# Patient Record
Sex: Male | Born: 1975 | Race: White | Hispanic: No | Marital: Single | Smoking: Former smoker
Health system: Southern US, Community
[De-identification: ages and names within clinical notes are randomized; demographics above are authoritative.]

## PROBLEM LIST (undated history)

## (undated) DIAGNOSIS — R12 Heartburn: Secondary | ICD-10-CM

## (undated) DIAGNOSIS — R37 Sexual dysfunction, unspecified: Secondary | ICD-10-CM

## (undated) DIAGNOSIS — R42 Dizziness and giddiness: Secondary | ICD-10-CM

## (undated) DIAGNOSIS — R350 Frequency of micturition: Secondary | ICD-10-CM

## (undated) DIAGNOSIS — R51 Headache: Secondary | ICD-10-CM

## (undated) DIAGNOSIS — G479 Sleep disorder, unspecified: Secondary | ICD-10-CM

## (undated) DIAGNOSIS — F429 Obsessive-compulsive disorder, unspecified: Secondary | ICD-10-CM

## (undated) DIAGNOSIS — K30 Functional dyspepsia: Secondary | ICD-10-CM

## (undated) HISTORY — PX: FEMUR FRACTURE SURGERY: SHX633

## (undated) HISTORY — DX: Dizziness and giddiness: R42

## (undated) HISTORY — DX: Sexual dysfunction, unspecified: R37

## (undated) HISTORY — DX: Obsessive-compulsive disorder, unspecified: F42.9

## (undated) HISTORY — DX: Frequency of micturition: R35.0

## (undated) HISTORY — DX: Sleep disorder, unspecified: G47.9

## (undated) HISTORY — DX: Functional dyspepsia: K30

## (undated) HISTORY — DX: Headache: R51

## (undated) HISTORY — DX: Heartburn: R12

## (undated) HISTORY — PX: NASAL SEPTUM SURGERY: SHX37

---

## 2011-01-10 ENCOUNTER — Encounter: Payer: Self-pay | Admitting: Cardiology

## 2011-01-10 ENCOUNTER — Ambulatory Visit (INDEPENDENT_AMBULATORY_CARE_PROVIDER_SITE_OTHER): Payer: 59 | Admitting: Cardiology

## 2011-01-10 DIAGNOSIS — R002 Palpitations: Secondary | ICD-10-CM | POA: Insufficient documentation

## 2011-01-10 DIAGNOSIS — R079 Chest pain, unspecified: Secondary | ICD-10-CM | POA: Insufficient documentation

## 2011-01-10 NOTE — Progress Notes (Signed)
Parker Owens Date of Birth: 1975-08-04 Medical Record #[id number]  History of Present Illness: Parker Owens is a pleasant 35 year old white male who is seen at the request of Dr. Conley Owens for evaluation of chest pain. In general he has been in good health. Over the past 2 months he started a running program and found that when he was 5-10 minutes into his run he would get a vague discomfort in his mid chest described as a dull ache. It was 2/10. Sometimes he would have discomfort in his right shoulder and other times he would get discomfort in his left shoulder. He would just run through his discomfort and it would persist throughout his workout and then resolve with rest. There's been no change in these pattern over the past 2 months. He previously was biking and noted no problems with biking. He has been training for a Psychologist, occupational. He has no cardiac risk factors.  No current outpatient prescriptions on file prior to visit.    No Known Allergies  Past Medical History  Diagnosis Date  . OCD (obsessive compulsive disorder)   . Sleep difficulties   . Chest pain   . Hemorrhoids   . Heartburn   . Indigestion   . Constipation   . Frequent urination     Problems starting or stopping urine flow  . Sexual dysfunction   . Headache   . Dizziness     Past Surgical History  Procedure Date  . Nasal septum surgery   . Femur fracture surgery     History  Smoking status  . Former Smoker  . Quit date: 01/10/2008  Smokeless tobacco  . Not on file    History  Alcohol Use  . Yes    Family History  Problem Relation Age of Onset  . Heart disease Mother   . Hypertension Mother   . Hyperlipidemia Maternal Grandmother   . Heart attack Maternal Grandfather   . Hyperlipidemia Maternal Grandfather   . Hypertension Maternal Grandfather     Review of Systems: The review of systems is positive for history of intermittent palpitations. He has noted his heart rate as high as 180 beats per  minute. He doesn't Valsalva maneuver and this goes away. These episodes occur less than once a month. These are unassociated with his chest pain.  All other systems were reviewed and are negative.  Physical Exam: BP 115/71  Pulse 63  Ht 6\' 1"  (1.854 m)  Wt 196 lb (88.905 kg)  BMI 25.86 kg/m2 The patient is alert and oriented x 3.  The mood and affect are normal.  The skin is warm and dry.  Color is normal.  The HEENT exam reveals that the sclera are nonicteric.  The mucous membranes are moist.  The carotids are 2+ without bruits.  There is no thyromegaly.  There is no JVD.  The lungs are clear.  The chest wall is non tender.  The heart exam reveals a regular rate with a normal S1 and S2.  There are no murmurs, gallops, or rubs.  The PMI is not displaced.   Abdominal exam reveals good bowel sounds.  There is no guarding or rebound.  There is no hepatosplenomegaly or tenderness.  There are no masses.  Exam of the legs reveal no clubbing, cyanosis, or edema.  The legs are without rashes.  The distal pulses are intact.  Cranial nerves II - XII are intact.  Motor and sensory functions are intact.  The gait is normal.  LABORATORY DATA: ECG today shows normal sinus rhythm with a rate of 59 beats per minute. It is a normal ECG. Prior ECG aturgent care was also normal.  Assessment / Plan:

## 2011-01-10 NOTE — Assessment & Plan Note (Signed)
His symptoms are somewhat atypical for angina. He has no cardiac risk factors for premature coronary disease. I think his risk of coronary disease is low. I suspect that his symptoms are related to recent efforts to increase his conditioning with running. I recommended that he continue his exercise program. I suspect that his symptoms will improve with continued improvement in his exercise tolerance. If he has any worsening of these symptoms or if they persist then we can consider a screening stress test. However, at this point I think his pretest likelihood of disease is very low.

## 2011-01-10 NOTE — Assessment & Plan Note (Signed)
His history is suggestive of intermittent SVT. 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 I recommended he avoid caffeine and decongestants. If he has increased symptoms with either frequency or severity we could consider medical therapy with a beta blocker once we have documented his arrhythmia. At this point he is quite comfortable using the Valsalva maneuver.

## 2011-01-10 NOTE — Patient Instructions (Signed)
Continue to exercise and build up your tolerance. If your chest pain worsens or if it doesn't improve with conditioning then we can do a screening stress test.  Avoid caffeine and decongestants. (pseudoephredrine)  If you have a sustained episode of palpitations try and document on a monitor. If these become more frequent or severe let me know and we could try some medical.  I will see you again as needed.

## 2011-01-25 ENCOUNTER — Telehealth: Payer: Self-pay | Admitting: Cardiology

## 2011-01-25 NOTE — Telephone Encounter (Signed)
Patient called, stated he was told to call back to report any problems.Stated last Thursday night while running had episode of chest pain rt. shoulder pain and jaw pain ,nausea.Lasted appox. .while running and lasted appox. 2 min. after running.Has not ran any more since, wanted to know what to do.

## 2011-01-25 NOTE — Telephone Encounter (Signed)
lmtc

## 2011-01-25 NOTE — Telephone Encounter (Signed)
New Msg: Pt calling c/o chest pain and shoulder pain in the past. Pt said last time he went running pt had increased chest pain, shoulder pain, jaw pain and nasuea. Pt not c/o chest pain currently. Pt was told to contact Dr. Justin Mend if pt chest pain and/or shoulder pain got worse or if pt has any other symptoms. Please return pt call to discuss further.

## 2011-01-25 NOTE — Telephone Encounter (Signed)
Patient called no answer,lmtc 

## 2011-01-25 NOTE — Telephone Encounter (Signed)
I would recommend scheduling him for a routine stress test since he is still having symptoms. Parker Owens

## 2011-01-26 NOTE — Telephone Encounter (Signed)
Patient was called and told Dr. [place] recommends a routine stress test .Patient agreed to schedule treadmill.

## 2011-01-31 ENCOUNTER — Telehealth: Payer: Self-pay | Admitting: *Deleted

## 2011-01-31 NOTE — Telephone Encounter (Signed)
Left message on 01/28/11/01/31/11 for patient to call and scheduled treadmill. Patient has not call me back.

## 2011-02-10 ENCOUNTER — Encounter: Payer: Self-pay | Admitting: *Deleted

## 2011-02-10 NOTE — Telephone Encounter (Signed)
Sent him a letter to call us back to schedule treadmill. Have left several messages w/no return call.

## 2012-10-30 ENCOUNTER — Emergency Department (HOSPITAL_BASED_OUTPATIENT_CLINIC_OR_DEPARTMENT_OTHER)
Admission: EM | Admit: 2012-10-30 | Discharge: 2012-10-30 | Disposition: A | Discharge status: Home / Self Care | Payer: Worker's Compensation | Attending: Emergency Medicine | Admitting: Emergency Medicine

## 2012-10-30 ENCOUNTER — Emergency Department (HOSPITAL_BASED_OUTPATIENT_CLINIC_OR_DEPARTMENT_OTHER): Payer: Worker's Compensation

## 2012-10-30 ENCOUNTER — Encounter (HOSPITAL_BASED_OUTPATIENT_CLINIC_OR_DEPARTMENT_OTHER): Payer: Self-pay

## 2012-10-30 DIAGNOSIS — F429 Obsessive-compulsive disorder, unspecified: Secondary | ICD-10-CM | POA: Insufficient documentation

## 2012-10-30 DIAGNOSIS — Y939 Activity, unspecified: Secondary | ICD-10-CM | POA: Insufficient documentation

## 2012-10-30 DIAGNOSIS — Y9289 Other specified places as the place of occurrence of the external cause: Secondary | ICD-10-CM | POA: Insufficient documentation

## 2012-10-30 DIAGNOSIS — S0990XA Unspecified injury of head, initial encounter: Secondary | ICD-10-CM

## 2012-10-30 DIAGNOSIS — R42 Dizziness and giddiness: Secondary | ICD-10-CM | POA: Insufficient documentation

## 2012-10-30 DIAGNOSIS — K3189 Other diseases of stomach and duodenum: Secondary | ICD-10-CM | POA: Insufficient documentation

## 2012-10-30 DIAGNOSIS — Z8719 Personal history of other diseases of the digestive system: Secondary | ICD-10-CM | POA: Insufficient documentation

## 2012-10-30 DIAGNOSIS — Z8659 Personal history of other mental and behavioral disorders: Secondary | ICD-10-CM | POA: Insufficient documentation

## 2012-10-30 DIAGNOSIS — S060X9A Concussion with loss of consciousness of unspecified duration, initial encounter: Secondary | ICD-10-CM | POA: Insufficient documentation

## 2012-10-30 DIAGNOSIS — Z791 Long term (current) use of non-steroidal anti-inflammatories (NSAID): Secondary | ICD-10-CM | POA: Insufficient documentation

## 2012-10-30 DIAGNOSIS — R55 Syncope and collapse: Secondary | ICD-10-CM | POA: Insufficient documentation

## 2012-10-30 DIAGNOSIS — W2209XA Striking against other stationary object, initial encounter: Secondary | ICD-10-CM | POA: Insufficient documentation

## 2012-10-30 DIAGNOSIS — Z79899 Other long term (current) drug therapy: Secondary | ICD-10-CM | POA: Insufficient documentation

## 2012-10-30 DIAGNOSIS — Z87891 Personal history of nicotine dependence: Secondary | ICD-10-CM | POA: Insufficient documentation

## 2012-10-30 DIAGNOSIS — Z8679 Personal history of other diseases of the circulatory system: Secondary | ICD-10-CM | POA: Insufficient documentation

## 2012-10-30 MED ORDER — TRAMADOL HCL 50 MG PO TABS: 50.0000 mg | ORAL_TABLET | Freq: Four times a day (QID) | ORAL | Status: DC | PRN

## 2012-10-30 MED ORDER — NAPROXEN 500 MG PO TABS: 500.0000 mg | ORAL_TABLET | Freq: Two times a day (BID) | ORAL | Status: DC
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
## 2012-10-30 NOTE — ED Notes (Signed)
Pt had a syncopal episode lasting 20 seconds PTA.  States he struck head on glass yesterday.

## 2012-10-30 NOTE — ED Notes (Signed)
MD at bedside. 

## 2012-10-30 NOTE — ED Notes (Signed)
MD at bedside giving results and plan of care.

## 2013-07-10 ENCOUNTER — Ambulatory Visit: Payer: Self-pay | Admitting: Internal Medicine

## 2013-07-26 ENCOUNTER — Ambulatory Visit: Payer: Self-pay | Admitting: Internal Medicine

## 2014-08-25 ENCOUNTER — Other Ambulatory Visit: Payer: Self-pay | Admitting: Nurse Practitioner

## 2014-08-25 DIAGNOSIS — K219 Gastro-esophageal reflux disease without esophagitis: Principal | ICD-10-CM

## 2014-08-25 DIAGNOSIS — IMO0001 Reserved for inherently not codable concepts without codable children: Secondary | ICD-10-CM

## 2014-09-09 ENCOUNTER — Ambulatory Visit: Admission: RE | Admit: 2014-09-09 | Payer: Self-pay | Source: Ambulatory Visit

## 2014-09-09 ENCOUNTER — Inpatient Hospital Stay: Admission: RE | Admit: 2014-09-09 | Payer: Self-pay | Source: Ambulatory Visit

## 2015-07-31 ENCOUNTER — Emergency Department (HOSPITAL_COMMUNITY)
Admission: EM | Admit: 2015-07-31 | Discharge: 2015-07-31 | Disposition: A | Discharge status: Home / Self Care | Payer: BLUE CROSS/BLUE SHIELD | Attending: Emergency Medicine | Admitting: Emergency Medicine

## 2015-07-31 ENCOUNTER — Emergency Department (HOSPITAL_COMMUNITY): Payer: BLUE CROSS/BLUE SHIELD

## 2015-07-31 ENCOUNTER — Encounter (HOSPITAL_COMMUNITY): Payer: Self-pay

## 2015-07-31 DIAGNOSIS — F429 Obsessive-compulsive disorder, unspecified: Secondary | ICD-10-CM | POA: Diagnosis not present

## 2015-07-31 DIAGNOSIS — Z87891 Personal history of nicotine dependence: Secondary | ICD-10-CM | POA: Diagnosis not present

## 2015-07-31 DIAGNOSIS — R079 Chest pain, unspecified: Secondary | ICD-10-CM | POA: Diagnosis not present

## 2015-07-31 DIAGNOSIS — R0789 Other chest pain: Secondary | ICD-10-CM | POA: Diagnosis present

## 2015-07-31 DIAGNOSIS — Z791 Long term (current) use of non-steroidal anti-inflammatories (NSAID): Secondary | ICD-10-CM | POA: Diagnosis not present

## 2015-07-31 DIAGNOSIS — Z79899 Other long term (current) drug therapy: Secondary | ICD-10-CM | POA: Insufficient documentation

## 2015-07-31 LAB — CBC
HEMATOCRIT: 42.3 % (ref 39.0–52.0)
HEMOGLOBIN: 13.8 g/dL (ref 13.0–17.0)
MCH: 28.2 pg (ref 26.0–34.0)
MCHC: 32.6 g/dL (ref 30.0–36.0)
MCV: 86.3 fL (ref 78.0–100.0)
Platelets: 194 10*3/uL (ref 150–400)
RBC: 4.9 MIL/uL (ref 4.22–5.81)
RDW: 13.3 % (ref 11.5–15.5)
WBC: 6.6 10*3/uL (ref 4.0–10.5)

## 2015-07-31 LAB — BASIC METABOLIC PANEL
ANION GAP: 6 (ref 5–15)
BUN: 17 mg/dL (ref 6–20)
CO2: 26 mmol/L (ref 22–32)
Calcium: 9.4 mg/dL (ref 8.9–10.3)
Chloride: 105 mmol/L (ref 101–111)
Creatinine, Ser: 0.9 mg/dL (ref 0.61–1.24)
Glucose, Bld: 99 mg/dL (ref 65–99)
POTASSIUM: 4.3 mmol/L (ref 3.5–5.1)
SODIUM: 137 mmol/L (ref 135–145)

## 2015-07-31 LAB — I-STAT TROPONIN, ED: Troponin i, poc: 0 ng/mL (ref 0.00–0.08)

## 2015-07-31 LAB — D-DIMER, QUANTITATIVE (NOT AT ARMC)

## 2015-07-31 MED ORDER — ASPIRIN EC 325 MG PO TBEC
325.0000 mg | DELAYED_RELEASE_TABLET | Freq: Once | ORAL | Status: AC
  Administered 2015-07-31: 325 mg via ORAL
  Filled 2015-07-31: qty 1

## 2015-07-31 MED ORDER — ASPIRIN 81 MG PO CHEW
324.0000 mg | CHEWABLE_TABLET | Freq: Once | ORAL | Status: DC
  Filled 2015-07-31: qty 4
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
## 2015-07-31 NOTE — Discharge Instructions (Signed)
Return to the emergency room for worsening condition or new concerning symptoms. Follow up with your regular doctor. If you don't have a regular doctor use one of the numbers below to establish a primary care doctor. ° ° °Emergency Department Resource Guide °1) Find a Doctor and Pay Out of Pocket °Although you won't have to find out who is covered by your insurance plan, it is a good idea to ask around and get recommendations. You will then need to call the office and see if the doctor you have chosen will accept you as a new patient and what types of options they offer for patients who are self-pay. Some doctors offer discounts or will set up payment plans for their patients who do not have insurance, but you will need to ask so you aren't surprised when you get to your appointment. ° °2) Contact Your Local Health Department °Not all health departments have doctors that can see patients for sick visits, but many do, so it is worth a call to see if yours does. If you don't know where your local health department is, you can check in your phone book. The CDC also has a tool to help you locate your state's health department, and many state websites also have listings of all of their local health departments. ° °3) Find a Walk-in Clinic °If your illness is not likely to be very severe or complicated, you may want to try a walk in clinic. These are popping up all over the country in pharmacies, drugstores, and shopping centers. They're usually staffed by nurse practitioners or physician assistants that have been trained to treat common illnesses and complaints. They're usually fairly quick and inexpensive. However, if you have serious medical issues or chronic medical problems, these are probably not your best option. ° °No Primary Care Doctor: °- Call [facility] at  832-8000 - they can help you locate a primary care doctor that  accepts your insurance, provides certain services, etc. °- Physician Referral Service-  1-800-533-3463 ° °Emergency Department Resource Guide °1) Find a Doctor and Pay Out of Pocket °Although you won't have to find out who is covered by your insurance plan, it is a good idea to ask around and get recommendations. You will then need to call the office and see if the doctor you have chosen will accept you as a new patient and what types of options they offer for patients who are self-pay. Some doctors offer discounts or will set up payment plans for their patients who do not have insurance, but you will need to ask so you aren't surprised when you get to your appointment. ° °2) Contact Your Local Health Department °Not all health departments have doctors that can see patients for sick visits, but many do, so it is worth a call to see if yours does. If you don't know where your local health department is, you can check in your phone book. The CDC also has a tool to help you locate your state's health department, and many state websites also have listings of all of their local health departments. ° °3) Find a Walk-in Clinic °If your illness is not likely to be very severe or complicated, you may want to try a walk in clinic. These are popping up all over the country in pharmacies, drugstores, and shopping centers. They're usually staffed by nurse practitioners or physician assistants that have been trained to treat common illnesses and complaints. They're usually fairly quick and inexpensive. However,   if you have serious medical issues or chronic medical problems, these are probably not your best option. ° °No Primary Care Doctor: °- Call [facility] at  832-8000 - they can help you locate a primary care doctor that  accepts your insurance, provides certain services, etc. °- Physician Referral Service- 1-800-533-3463 ° °Chronic Pain Problems: °Organization         Address  Phone   Notes  °[facility]  ([phone]) [phone] Patients need to be referred by their primary care doctor.   ° °Medication Assistance: °Organization         Address  Phone   Notes  °[place] [facility] [address] °[place], [place] [address] [phone]) [phone] --Must be a resident of [place] °-- Must have NO insurance coverage whatsoever (no Medicaid/ Medicare, etc.) °-- The pt. MUST have a primary care doctor that directs their care regularly and follows them in the community °  °[facility]  (866) 331-1348   °[employer]  (888) 892-1162   ° °Agencies that provide inexpensive medical care: °Organization         Address  Phone   Notes  °[facility]  ([phone]) [phone]   °[facility]    (336) 832-7272   °[facility] [address] °[place], [place] [address] (336) 832-4777   °[facility] [address], °[place] ([phone]) [phone]   °[facility]    (336) 373-0678   °[facility]    (336) 272-1050   °[facility] ° [address], [place] Phone:  (336) [phone], Fax:  ([phone]) [phone] Hours of Operation:  9 am - 6 pm, M-F.  Also accepts Medicaid/Medicare and self-pay.  ° [facility] ° [address], [place] Phone: (336) [phone], Fax: ([phone]) [phone]. Hours of Operation:  8:30 am - 5:30 pm, M-F.  Also accepts Medicaid and self-pay.  °[facility] [place] [address], [place] Phone: ([phone]) [phone]   °[facility] [address], [place], [employer] (336)723-1848, Ext. 123 Mondays & Thursdays: 7-9 AM.  First 15 patients are seen on a first come, first serve basis. °  ° °Medicaid-accepting [place] Providers: ° °Organization         Address  Phone   Notes  °[facility] [address], [place] ([phone]) [phone] Also accepts self-pay patients.  °[facility] [address], [place] ° ([phone]) [phone]   °[facility] [address], [place] ([phone]) [phone]   °[facility] [address] [place] [address], [place] ([phone]) [phone]   °[facility] [address], [place]  ° ([phone]) [phone] Only accepts [place] Access Medicaid patients after they have their name applied to their card.  ° °Self-Pay (no insurance) in [place]: ° °Organization         Address  Phone   Notes  °Sickle Cell Patients, [facility] [address], [place] ([phone]) [phone]   °[facility] [address], [place] ([phone]) [phone]   °[facility] [place] ° [address], [place] ([phone]) [phone]   °[facility]/Dr. Osei-Bonsu ° [address] [place] [address],  or [address], [place] [phone]) [phone] Phone number for both [place]   and [place] locations is the same.  °[facility] [address], [place] ([phone]) [phone]   °[facility] [address] [place] [address], [place] or [address] ([phone]) [phone] °(336) [phone]   °[facility] [address], [place] ([phone]) [phone], phone; ([phone]) [phone], fax Sees patients 1st and 3rd Saturday of every month.  Must not qualify for public or private insurance (i.e. Medicaid, Medicare, [employer], Veterans' Benefits) • Household income should be no more than 200% of the poverty level •The clinic cannot treat you if you are pregnant or think you are pregnant • Sexually transmitted diseases are not treated at the clinic.  ° ° ° °

## 2015-07-31 NOTE — ED Provider Notes (Signed)
CSN: [id number]     Arrival date & time 07/31/15  4098 History   First MD Initiated Contact with Patient 07/31/15 [phone]     Chief Complaint  Patient presents with  . Chest Pain    HPI  Parker Owens is an 40 y.o. male with history of OCD who presents to the ED for evaluation of chest pain. He states he was in his usual state of health until this morning around 8 AM when he started experiencing centralized dull chest pressure. He states the intensity of the pressure waxes and wanes but it is always there. States the pressure radiates up his neck bilaterally and when it is most severe will cause left jaw pain and ear pain. He states the pain started when he was baking bagels in the kitchen. Denies any associated n/v, diaphoresis. He does endorse some associated shortness of breath that has now improved. Denies feeling faint or lightheaded. Denies personal h/o cardiac issues. States his maternal grandfather died at 8 of an MI and his grandmother has many blood clots. Denies tobacco use. Denies recent travel, new swelling, h/o malignancy. He does state he has some chronic back and disability issues so he is not quite as mobile/active as he used to be.  Past Medical History  Diagnosis Date  . OCD (obsessive compulsive disorder)   . Sleep difficulties   . Chest pain   . Hemorrhoids   . Heartburn   . Indigestion   . Constipation   . Frequent urination     Problems starting or stopping urine flow  . Sexual dysfunction   . Headache(784.0)   . Dizziness    Past Surgical History  Procedure Laterality Date  . Nasal septum surgery    . Femur fracture surgery     Family History  Problem Relation Age of Onset  . Heart disease Mother   . Hypertension Mother   . Hyperlipidemia Maternal Grandmother   . Heart attack Maternal Grandfather   . Hyperlipidemia Maternal Grandfather   . Hypertension Maternal Grandfather    Social History  Substance Use Topics  . Smoking status: Former Smoker     Quit date: 01/10/2008  . Smokeless tobacco: None  . Alcohol Use: Yes    Review of Systems  All other systems reviewed and are negative.     Allergies  Orange fruit  Home Medications   Prior to Admission medications   Medication Sig Start Date End Date Taking? Authorizing Provider  Azelastine-Fluticasone (DYMISTA NA) Place into the nose.      Historical Provider, MD  Cetirizine HCl (ZYRTEC ALLERGY PO) Take by mouth.      Historical Provider, MD  Eletriptan Hydrobromide (RELPAX PO) Take by mouth.      Historical Provider, MD  escitalopram (LEXAPRO) 20 MG tablet Take 20 mg by mouth daily.      Historical Provider, MD  IRON PO Take by mouth daily.      Historical Provider, MD  MethylPREDNISolone (MEDROL PO) Take by mouth as directed.      Historical Provider, MD  Multiple Minerals (MULTI-MINERALS PO) Take by mouth daily.      Historical Provider, MD  Multiple Vitamin (MULTI-VITAMIN PO) Take by mouth daily.      Historical Provider, MD  naproxen (NAPROSYN) 500 MG tablet Take 1 tablet (500 mg total) by mouth 2 (two) times daily with a meal. 10/30/12   Eber Hong, MD  Ranitidine HCl (ZANTAC PO) Take by mouth.  Historical Provider, MD  traMADol (ULTRAM) 50 MG tablet Take 1 tablet (50 mg total) by mouth every 6 (six) hours as needed for pain. 10/30/12   Eber HongBrian Miller, MD  valACYclovir (VALTREX) 1000 MG tablet Take 1,000 mg by mouth 2 (two) times daily.      Historical Provider, MD   BP 122/84 mmHg  Pulse 76  Temp(Src) 98.3 F (36.8 C) (Oral)  Resp 18  Ht 6\' 1"  (1.854 m)  Wt 102.059 kg  BMI 29.69 kg/m2 Physical Exam  Constitutional: He is oriented to person, place, and time.  HENT:  Right Ear: External ear normal.  Left Ear: External ear normal.  Nose: Nose normal.  Mouth/Throat: Oropharynx is clear and moist. No oropharyngeal exudate.  Eyes: Conjunctivae and EOM are normal. Pupils are equal, round, and reactive to light.  Neck: Normal range of motion. Neck supple.   Cardiovascular: Normal rate, regular rhythm, normal heart sounds and intact distal pulses.   Pulmonary/Chest: Effort normal and breath sounds normal. No respiratory distress. He has no wheezes. He exhibits no tenderness.  Abdominal: Soft. Bowel sounds are normal. He exhibits no distension. There is no tenderness. There is no rebound and no guarding.  Musculoskeletal: He exhibits no edema.  No LE edema  Neurological: He is alert and oriented to person, place, and time. No cranial nerve deficit.  Skin: Skin is warm and dry.  Psychiatric: He has a normal mood and affect.  Nursing note and vitals reviewed.   ED Course  Procedures (including critical care time) Labs Review Labs Reviewed  BASIC METABOLIC PANEL  CBC  D-DIMER, QUANTITATIVE (NOT AT [facility])  Rosezena SensorI-STAT TROPOININ, ED    Imaging Review Dg Chest 2 View  07/31/2015  CLINICAL DATA:  Chest pain, shortness of breath for 3 hours EXAM: CHEST  2 VIEW COMPARISON:  None. FINDINGS: Heart and mediastinal contours are within normal limits. No focal opacities or effusions. No acute bony abnormality. IMPRESSION: No active cardiopulmonary disease. Electronically Signed   By: Charlett NoseKevin  Dover M.D.   On: 07/31/2015 10:55   I have personally reviewed and evaluated these images and lab results as part of my medical decision-making.   EKG Interpretation   Date/Time:  Friday Jul 31 2015 09:41:20 EDT Ventricular Rate:  74 PR Interval:  164 QRS Duration: 86 QT Interval:  364 QTC Calculation: 404 R Axis:   71 Text Interpretation:  Normal sinus rhythm Low voltage QRS Borderline ECG  Confirmed by Lincoln Brighamees, Liz [phone]([id number]) on 07/31/2015 10:10:59 AM      MDM   Final diagnoses:  Chest pain, unspecified chest pain type    Labs negative including trop and d-dimer. EKG NSR. CXR negative. HEART score 0. Chest pain/pressure improved to 1-2 int he ED. Doubt PE. I have low suspicion for ACS. I did recommend a delta troponin. Pt would prefer not to stay for delta  troponin. I did discuss strict ER return precautions and close f/u with PCP. He is in agreement and understanding of plan.     Carlene CoriaSerena Y Barbar Brede, PA-C 07/31/15 1112  Tilden FossaElizabeth Rees, MD 08/02/15 [phone]

## 2015-09-18 ENCOUNTER — Ambulatory Visit
Admission: RE | Admit: 2015-09-18 | Discharge: 2015-09-18 | Disposition: A | Discharge status: Home / Self Care | Payer: BLUE CROSS/BLUE SHIELD | Source: Ambulatory Visit | Attending: Nurse Practitioner | Admitting: Nurse Practitioner

## 2015-09-18 ENCOUNTER — Other Ambulatory Visit: Payer: Self-pay | Admitting: Nurse Practitioner

## 2015-09-18 DIAGNOSIS — R197 Diarrhea, unspecified: Secondary | ICD-10-CM | POA: Insufficient documentation

## 2015-09-18 DIAGNOSIS — K4090 Unilateral inguinal hernia, without obstruction or gangrene, not specified as recurrent: Secondary | ICD-10-CM | POA: Insufficient documentation

## 2015-09-18 DIAGNOSIS — R10813 Right lower quadrant abdominal tenderness: Secondary | ICD-10-CM | POA: Insufficient documentation

## 2015-09-18 MED ORDER — IOPAMIDOL (ISOVUE-370) INJECTION 76%
100.0000 mL | Freq: Once | INTRAVENOUS | Status: AC | PRN
  Administered 2015-09-18: 100 mL via INTRAVENOUS

## 2015-09-28 ENCOUNTER — Ambulatory Visit: Payer: BLUE CROSS/BLUE SHIELD

## 2015-09-28 ENCOUNTER — Other Ambulatory Visit: Payer: Self-pay

## 2015-09-29 ENCOUNTER — Ambulatory Visit
Admission: RE | Admit: 2015-09-29 | Discharge: 2015-09-29 | Disposition: A | Discharge status: Home / Self Care | Payer: BLUE CROSS/BLUE SHIELD | Source: Ambulatory Visit | Attending: Nurse Practitioner | Admitting: Nurse Practitioner

## 2015-09-29 DIAGNOSIS — K219 Gastro-esophageal reflux disease without esophagitis: Secondary | ICD-10-CM | POA: Insufficient documentation

## 2015-09-29 DIAGNOSIS — IMO0001 Reserved for inherently not codable concepts without codable children: Secondary | ICD-10-CM

## 2016-02-16 ENCOUNTER — Other Ambulatory Visit: Payer: Self-pay | Admitting: Rehabilitation

## 2016-02-16 DIAGNOSIS — M47816 Spondylosis without myelopathy or radiculopathy, lumbar region: Secondary | ICD-10-CM

## 2016-02-23 ENCOUNTER — Other Ambulatory Visit: Payer: Self-pay

## 2016-02-25 ENCOUNTER — Ambulatory Visit
Admission: RE | Admit: 2016-02-25 | Discharge: 2016-02-25 | Disposition: A | Discharge status: Home / Self Care | Payer: BLUE CROSS/BLUE SHIELD | Source: Ambulatory Visit | Attending: Rehabilitation | Admitting: Rehabilitation

## 2016-02-25 DIAGNOSIS — M47816 Spondylosis without myelopathy or radiculopathy, lumbar region: Secondary | ICD-10-CM

## 2016-12-28 ENCOUNTER — Emergency Department (HOSPITAL_COMMUNITY)
Admission: EM | Admit: 2016-12-28 | Discharge: 2016-12-28 | Disposition: A | Discharge status: Home / Self Care | Payer: BLUE CROSS/BLUE SHIELD | Attending: Emergency Medicine | Admitting: Emergency Medicine

## 2016-12-28 ENCOUNTER — Emergency Department (HOSPITAL_COMMUNITY): Payer: BLUE CROSS/BLUE SHIELD

## 2016-12-28 ENCOUNTER — Encounter (HOSPITAL_COMMUNITY): Payer: Self-pay | Admitting: Cardiology

## 2016-12-28 DIAGNOSIS — Z79899 Other long term (current) drug therapy: Secondary | ICD-10-CM | POA: Insufficient documentation

## 2016-12-28 DIAGNOSIS — Z87891 Personal history of nicotine dependence: Secondary | ICD-10-CM | POA: Insufficient documentation

## 2016-12-28 DIAGNOSIS — N50812 Left testicular pain: Secondary | ICD-10-CM

## 2016-12-28 LAB — URINALYSIS, ROUTINE W REFLEX MICROSCOPIC
BILIRUBIN URINE: NEGATIVE
Glucose, UA: NEGATIVE mg/dL
Hgb urine dipstick: NEGATIVE
Ketones, ur: NEGATIVE mg/dL
LEUKOCYTES UA: NEGATIVE
NITRITE: NEGATIVE
PROTEIN: NEGATIVE mg/dL
Specific Gravity, Urine: 1.019 (ref 1.005–1.030)
pH: 6 (ref 5.0–8.0)

## 2016-12-28 MED ORDER — TRAMADOL HCL 50 MG PO TABS: 50.0000 mg | ORAL_TABLET | Freq: Once | ORAL | Status: DC

## 2016-12-28 MED ORDER — TRAMADOL HCL 50 MG PO TABS: 50.0000 mg | ORAL_TABLET | Freq: Four times a day (QID) | ORAL | 0 refills | Status: DC | PRN

## 2016-12-28 MED ORDER — CIPROFLOXACIN HCL 500 MG PO TABS: 500.0000 mg | ORAL_TABLET | Freq: Two times a day (BID) | ORAL | 0 refills | Status: AC

## 2016-12-28 NOTE — ED Notes (Signed)
Pt with left testicle pain for 2 days, denies any injury to area. Denies swelling.  Instructed pt to get an urine sample when able.

## 2016-12-28 NOTE — ED Provider Notes (Signed)
[facility] EMERGENCY DEPARTMENT Provider Note   CSN: [id number] Arrival date & time: 12/28/16  1316     History   Chief Complaint Chief Complaint  Patient presents with  . Testicle Pain    HPI Parker Owens is a 41 y.o. male.  Patient complains of pain in his left testicle.  This started couple days ago and has gotten worse   The history is provided by the patient.  Testicle Pain  This is a new problem. The current episode started 2 days ago. The problem occurs constantly. The problem has not changed since onset.Pertinent negatives include no chest pain, no abdominal pain and no headaches. Exacerbated by: Palpation. Nothing relieves the symptoms.    Past Medical History:  Diagnosis Date  . Chest pain   . Constipation   . Dizziness   . Frequent urination    Problems starting or stopping urine flow  . Headache(784.0)   . Heartburn   . Hemorrhoids   . Indigestion   . OCD (obsessive compulsive disorder)   . Sexual dysfunction   . Sleep difficulties     Patient Active Problem List   Diagnosis Date Noted  . Chest pain 01/10/2011  . Palpitations 01/10/2011    Past Surgical History:  Procedure Laterality Date  . FEMUR FRACTURE SURGERY    . NASAL SEPTUM SURGERY         Home Medications    Prior to Admission medications   Medication Sig Start Date End Date Taking? Authorizing Provider  celecoxib (CELEBREX) 200 MG capsule Take 200 mg by mouth 2 (two) times daily.    Yes [provider]  cetirizine (ZYRTEC) 10 MG tablet Take 10 mg by mouth daily.   Yes [provider]  escitalopram (LEXAPRO) 20 MG tablet Take 20 mg by mouth daily.     Yes [provider]  Multiple Minerals (MULTI-MINERALS PO) Take by mouth daily.     Yes [provider]  ranitidine (ZANTAC) 150 MG tablet Take 150 mg by mouth 2 (two) times daily. 07/15/15  Yes [provider]  valACYclovir (VALTREX) 1000 MG tablet Take 1,000 mg by mouth daily.     Yes [provider]  ciprofloxacin (CIPRO) 500 MG tablet Take 1 tablet (500 mg total) by mouth 2 (two) times daily. One po bid x 7 days 12/28/16   Bethann Berkshire, MD  fluticasone [facility]) 50 MCG/ACT nasal spray Place 2 sprays into both nostrils daily.    [provider]  traMADol (ULTRAM) 50 MG tablet Take 1 tablet (50 mg total) by mouth every 6 (six) hours as needed. 12/28/16   Bethann Berkshire, MD    Family History Family History  Problem Relation Age of Onset  . Heart disease Mother   . Hypertension Mother   . Heart attack Maternal Grandfather   . Hyperlipidemia Maternal Grandfather   . Hypertension Maternal Grandfather   . Hyperlipidemia Maternal Grandmother     Social History Social History  Substance Use Topics  . Smoking status: Former Smoker    Quit date: 01/10/2008  . Smokeless tobacco: Not on file  . Alcohol use Yes     Allergies   Orange fruit [citrus]   Review of Systems Review of Systems  Constitutional: Negative for appetite change and fatigue.  HENT: Negative for congestion, ear discharge and sinus pressure.   Eyes: Negative for discharge.  Respiratory: Negative for cough.   Cardiovascular: Negative for chest pain.  Gastrointestinal: Negative for abdominal pain and diarrhea.  Genitourinary: Positive for testicular pain. Negative for frequency and hematuria.  Musculoskeletal: Negative for back pain.  Skin: Negative for rash.  Neurological: Negative for seizures and headaches.  Psychiatric/Behavioral: Negative for hallucinations.     Physical Exam Updated Vital Signs BP 122/90 (BP Location: Right Arm)   Pulse 79   Temp 98 F (36.7 C) (Oral)   Resp 18   Ht 6\' 1"  (1.854 m)   Wt 104.3 kg (230 lb)   SpO2 100%   BMI 30.34 kg/m   Physical Exam  Constitutional: He is oriented to person, place, and time. He appears well-developed.  HENT:  Head: Normocephalic.  Eyes: Conjunctivae and EOM are normal. No scleral icterus.  Neck: Neck  supple. No thyromegaly present.  Cardiovascular: Normal rate and regular rhythm.  Exam reveals no gallop and no friction rub.   No murmur heard. Pulmonary/Chest: No stridor. He has no wheezes. He has no rales. He exhibits no tenderness.  Abdominal: He exhibits no distension. There is no tenderness. There is no rebound.  Genitourinary:  Genitourinary Comments: Patient has tenderness to left testicle mild and tenderness in the left groin area  Musculoskeletal: Normal range of motion. He exhibits no edema.  Lymphadenopathy:    He has no cervical adenopathy.  Neurological: He is oriented to person, place, and time. He exhibits normal muscle tone. Coordination normal.  Skin: No rash noted. No erythema.  Psychiatric: He has a normal mood and affect. His behavior is normal.     ED Treatments / Results  Labs (all labs ordered are listed, but only abnormal results are displayed) Labs Reviewed  URINE CULTURE  URINALYSIS, ROUTINE W REFLEX MICROSCOPIC    EKG  EKG Interpretation None       Radiology [place] Scrotum  Result Date: 12/28/2016 CLINICAL DATA:  Mild left testicular pain for the past 2 days. EXAM: SCROTAL ULTRASOUND DOPPLER ULTRASOUND OF THE TESTICLES TECHNIQUE: Complete ultrasound examination of the testicles, epididymis, and other scrotal structures was performed. Color and spectral Doppler ultrasound were also utilized to evaluate blood flow to the testicles. COMPARISON:  None. FINDINGS: Right testicle Measurements: 3.8 x 2.6 x 1.8 cm. No mass or microlithiasis visualized. Left testicle Measurements: 3.7 x 2.8 x 2.0 cm. No mass or microlithiasis visualized. Right epididymis:  5 mm cyst in the head of the epididymis. Left epididymis:  Normal in size and appearance. Hydrocele:  None visualized. Varicocele:  None visualized. Pulsed Doppler interrogation of both testes demonstrates normal low resistance arterial and venous waveforms bilaterally. IMPRESSION: 5 mm right epididymal cyst.   Otherwise, normal examination. Electronically Signed   By: Beckie SaltsSteven  Reid M.D.   On: 12/28/2016 14:48   [place] Scrotom Doppler  Result Date: 12/28/2016 CLINICAL DATA:  Mild left testicular pain for the past 2 days. EXAM: SCROTAL ULTRASOUND DOPPLER ULTRASOUND OF THE TESTICLES TECHNIQUE: Complete ultrasound examination of the testicles, epididymis, and other scrotal structures was performed. Color and spectral Doppler ultrasound were also utilized to evaluate blood flow to the testicles. COMPARISON:  None. FINDINGS: Right testicle Measurements: 3.8 x 2.6 x 1.8 cm. No mass or microlithiasis visualized. Left testicle Measurements: 3.7 x 2.8 x 2.0 cm. No mass or microlithiasis visualized. Right epididymis:  5 mm cyst in the head of the epididymis. Left epididymis:  Normal in size and appearance. Hydrocele:  None visualized. Varicocele:  None visualized. Pulsed Doppler interrogation of both testes demonstrates normal low resistance arterial and venous waveforms bilaterally. IMPRESSION: 5 mm right epididymal cyst.  Otherwise, normal examination. Electronically Signed  By: Beckie Salts M.D.   On: 12/28/2016 14:48    Procedures Procedures (including critical care time)  Medications Ordered in ED Medications - No data to display   Initial Impression / Assessment and Plan / ED Course  I have reviewed the triage vital signs and the nursing notes.  Pertinent labs & imaging results that were available during my care of the patient were reviewed by me and considered in my medical decision making (see chart for details).   Patient with testicle pain and normal ultrasound of the testicle.  We will culture his urine and place him on some antibiotics and given pain medicine and refer him to urology   Final Clinical Impressions(s) / ED Diagnoses   Final diagnoses:  Testicular pain, left    New Prescriptions New Prescriptions   CIPROFLOXACIN (CIPRO) 500 MG TABLET    Take 1 tablet (500 mg total) by mouth 2 (two)  times daily. One po bid x 7 days   TRAMADOL (ULTRAM) 50 MG TABLET    Take 1 tablet (50 mg total) by mouth every 6 (six) hours as needed.     Bethann Berkshire, MD 12/28/16 1534

## 2016-12-28 NOTE — ED Triage Notes (Signed)
Left testicle pain times 2 days.

## 2016-12-28 NOTE — Discharge Instructions (Signed)
Follow up with [facility] in 1 week °

## 2016-12-30 LAB — URINE CULTURE: CULTURE: NO GROWTH

## 2017-04-14 ENCOUNTER — Other Ambulatory Visit: Payer: Self-pay

## 2017-04-14 MED ORDER — CELECOXIB 200 MG PO CAPS: 200.0000 mg | ORAL_CAPSULE | Freq: Two times a day (BID) | ORAL | 1 refills | Status: DC

## 2017-04-14 MED ORDER — VALACYCLOVIR HCL 1 G PO TABS: 1000.0000 mg | ORAL_TABLET | Freq: Every day | ORAL | 0 refills | Status: DC

## 2017-04-14 MED ORDER — RANITIDINE HCL 150 MG PO TABS: 150.0000 mg | ORAL_TABLET | Freq: Two times a day (BID) | ORAL | 5 refills | Status: DC

## 2017-04-14 MED ORDER — ESCITALOPRAM OXALATE 20 MG PO TABS: 20.0000 mg | ORAL_TABLET | Freq: Every day | ORAL | 3 refills | Status: DC

## 2017-06-29 ENCOUNTER — Other Ambulatory Visit: Payer: Self-pay | Admitting: Nurse Practitioner

## 2017-06-29 MED ORDER — VALACYCLOVIR HCL 1 G PO TABS: 1000.0000 mg | ORAL_TABLET | Freq: Every day | ORAL | 0 refills | Status: DC

## 2017-08-01 ENCOUNTER — Other Ambulatory Visit: Payer: Self-pay

## 2017-08-01 MED ORDER — CELECOXIB 200 MG PO CAPS: 200.0000 mg | ORAL_CAPSULE | Freq: Two times a day (BID) | ORAL | 1 refills | Status: DC

## 2017-08-14 ENCOUNTER — Ambulatory Visit: Payer: Self-pay | Admitting: Nurse Practitioner

## 2017-08-14 ENCOUNTER — Encounter: Payer: Self-pay | Admitting: Nurse Practitioner

## 2017-08-14 VITALS — BP 115/71 | HR 71 | Resp 16 | Ht 73.0 in | Wt 232.0 lb

## 2017-08-14 DIAGNOSIS — F3341 Major depressive disorder, recurrent, in partial remission: Secondary | ICD-10-CM

## 2017-08-14 DIAGNOSIS — H66003 Acute suppurative otitis media without spontaneous rupture of ear drum, bilateral: Secondary | ICD-10-CM

## 2017-08-14 DIAGNOSIS — N529 Male erectile dysfunction, unspecified: Secondary | ICD-10-CM

## 2017-08-14 DIAGNOSIS — R300 Dysuria: Secondary | ICD-10-CM

## 2017-08-14 DIAGNOSIS — G8929 Other chronic pain: Secondary | ICD-10-CM

## 2017-08-14 DIAGNOSIS — M545 Low back pain: Secondary | ICD-10-CM

## 2017-08-14 DIAGNOSIS — Z0001 Encounter for general adult medical examination with abnormal findings: Secondary | ICD-10-CM

## 2017-08-14 MED ORDER — OFLOXACIN 0.3 % OP SOLN: OPHTHALMIC | 0 refills | Status: AC

## 2017-08-14 MED ORDER — SILDENAFIL CITRATE 100 MG PO TABS: 100.0000 mg | ORAL_TABLET | Freq: Every day | ORAL | 3 refills | Status: AC | PRN

## 2017-08-14 NOTE — Progress Notes (Signed)
[facility] [address] [place], [place] [address]  Internal MEDICINE  Office Visit Note  Patient Name: Parker Owens  [id number]  [id number]  Date of Service: 09/02/2017   Pt is here for routine health maintenance examination   Chief Complaint  Patient presents with  . Annual Exam  . Back Pain    chronic     Back Pain  This is a chronic problem. The current episode started more than 1 year ago. The problem occurs intermittently. The problem is unchanged. The pain is present in the lumbar spine. The quality of the pain is described as aching. The pain does not radiate. The symptoms are aggravated by bending, sitting, stress and twisting. Stiffness is present at night. Associated symptoms include leg pain and numbness. Pertinent negatives include no abdominal pain, chest pain or dysuria. He has tried NSAIDs, muscle relaxant and heat for the symptoms. The treatment provided moderate relief.    Current Medication: Outpatient Encounter Medications as of 08/14/2017  Medication Sig  . celecoxib (CELEBREX) 200 MG capsule Take 1 capsule (200 mg total) by mouth 2 (two) times daily.  . cetirizine (ZYRTEC) 10 MG tablet Take 10 mg by mouth daily.  Marland Kitchen escitalopram (LEXAPRO) 20 MG tablet Take 1 tablet (20 mg total) by mouth daily.  . fluticasone (FLONASE) 50 MCG/ACT nasal spray Place 2 sprays into both nostrils daily.  . Multiple Minerals (MULTI-MINERALS PO) Take by mouth daily.    . ranitidine (ZANTAC) 150 MG tablet Take 1 tablet (150 mg total) by mouth 2 (two) times daily.  . ciprofloxacin (CIPRO) 500 MG tablet Take 1 tablet (500 mg total) by mouth 2 (two) times daily. One po bid x 7 days (Patient not taking: Reported on 08/14/2017)  . ofloxacin (OCUFLOX) 0.3 % ophthalmic solution Use 4 drops in both ears twice daily for 7 days.  . sildenafil (REVATIO) 20 MG tablet Take 20 mg by mouth daily as needed.  . sildenafil (VIAGRA) 100 MG tablet Take 1 tablet (100 mg total) by mouth  daily as needed for erectile dysfunction.  . valACYclovir (VALTREX) 1000 MG tablet Take 1 tablet (1,000 mg total) by mouth daily.  . [DISCONTINUED] traMADol (ULTRAM) 50 MG tablet Take 1 tablet (50 mg total) by mouth every 6 (six) hours as needed. (Patient not taking: Reported on 08/14/2017)   No facility-administered encounter medications on file as of 08/14/2017.     Surgical History: Past Surgical History:  Procedure Laterality Date  . FEMUR FRACTURE SURGERY    . NASAL SEPTUM SURGERY      Medical History: Past Medical History:  Diagnosis Date  . Chest pain   . Constipation   . Dizziness   . Frequent urination    Problems starting or stopping urine flow  . Headache(784.0)   . Heartburn   . Hemorrhoids   . Indigestion   . OCD (obsessive compulsive disorder)   . Sexual dysfunction   . Sleep difficulties     Family History: Family History  Problem Relation Age of Onset  . Heart disease Mother   . Hypertension Mother   . Heart attack Maternal Grandfather   . Hyperlipidemia Maternal Grandfather   . Hypertension Maternal Grandfather   . Hyperlipidemia Maternal Grandmother       Review of Systems  Constitutional: Negative for activity change, chills, fatigue and unexpected weight change.  HENT: Positive for congestion. Negative for postnasal drip, rhinorrhea, sneezing and sore throat.   Eyes: Negative.  Negative for redness.  Respiratory: Negative for apnea, cough, chest tightness, shortness of breath and wheezing.   Cardiovascular: Negative for chest pain and palpitations.  Gastrointestinal: Negative for abdominal pain, constipation, diarrhea, nausea and vomiting.  Endocrine: Negative for cold intolerance, heat intolerance, polydipsia, polyphagia and polyuria.  Genitourinary: Negative for dysuria and frequency.  Musculoskeletal: Positive for back pain. Negative for arthralgias, joint swelling and neck pain.  Skin: Negative for rash.  Allergic/Immunologic: Positive for  environmental allergies.  Neurological: Positive for numbness. Negative for tremors.  Hematological: Negative for adenopathy. Does not bruise/bleed easily.  Psychiatric/Behavioral: Positive for dysphoric mood. Negative for behavioral problems (Depression), sleep disturbance and suicidal ideas. The patient is nervous/anxious.      Today's Vitals   08/14/17 1049  BP: 115/71  Pulse: 71  Resp: 16  SpO2: 99%  Weight: 232 lb (105.2 kg)  Height: 6\' 1"  (1.854 m)    Physical Exam  Constitutional: He is oriented to person, place, and time. He appears well-developed and well-nourished. No distress.  HENT:  Head: Normocephalic and atraumatic.  Right Ear: Tympanic membrane is erythematous and bulging.  Left Ear: Tympanic membrane is erythematous and bulging.  Nose: Nose normal.  Mouth/Throat: Oropharynx is clear and moist. No oropharyngeal exudate.  Eyes: Pupils are equal, round, and reactive to light. Conjunctivae and EOM are normal.  Neck: Normal range of motion. Neck supple. No JVD present. No tracheal deviation present. No thyromegaly present.  Cardiovascular: Normal rate, regular rhythm, normal heart sounds and intact distal pulses. Exam reveals no gallop and no friction rub.  No murmur heard. Pulmonary/Chest: Effort normal and breath sounds normal. No respiratory distress. He has no wheezes. He has no rales. He exhibits no tenderness.  Abdominal: Soft. Bowel sounds are normal. There is no tenderness.  Musculoskeletal: Normal range of motion.  Lymphadenopathy:    He has no cervical adenopathy.  Neurological: He is alert and oriented to person, place, and time. No cranial nerve deficit.  Skin: Skin is warm and dry. Capillary refill takes less than 2 seconds. He is not diaphoretic.  Psychiatric: He has a normal mood and affect. His behavior is normal. Judgment and thought content normal.     LABS: Recent Results (from the past 2160 hour(s))  Urinalysis, Routine w reflex microscopic      Status: None   Collection Time: 08/14/17  3:48 PM  Result Value Ref Range   Specific Gravity, UA 1.025 1.005 - 1.030   pH, UA 6.0 5.0 - 7.5   Color, UA Yellow Yellow   Appearance Ur Clear Clear   Leukocytes, UA Negative Negative   Protein, UA Negative Negative/Trace   Glucose, UA Negative Negative   Ketones, UA Negative Negative   RBC, UA Negative Negative   Bilirubin, UA Negative Negative   Urobilinogen, Ur 0.2 0.2 - 1.0 mg/dL   Nitrite, UA Negative Negative   Microscopic Examination Comment     Comment: Microscopic not indicated and not performed.   Assessment/Plan:  1. Encounter for general adult medical examination with abnormal findings Annual health maintenance exam today.   2. Non-recurrent acute suppurative otitis media of both ears without spontaneous rupture of tympanic membranes Ofloxacin ear drops should be used twice daily for next 7-10 days.  - ofloxacin (OCUFLOX) 0.3 % ophthalmic solution; Use 4 drops in both ears twice daily for 7 days.  Dispense: 5 mL; Refill: 0  3. Erectile dysfunction, unspecified erectile dysfunction type - sildenafil (VIAGRA) 100 MG tablet; Take 1 tablet (100 mg total) by mouth daily as  needed for erectile dysfunction.  Dispense: 30 tablet; Refill: 3  4. Chronic midline low back pain, with sciatica presence unspecified Contiinue celebrex daily to control pain and inflammation.    5. Recurrent major depressive disorder, in partial remission (HCC) Continue lexapro as prescribed.   6. Dysuria - Urinalysis, Routine w reflex microscopic  General Counseling: Rolanda Lundborgatrick verbalizes understanding of the findings of todays visit and agrees with plan of treatment. I have discussed any further diagnostic evaluation that may be needed or ordered today. We also reviewed his medications today. he has been encouraged to call the office with any questions or concerns that should arise related to todays visit.    Counseling:  This patient was seen by  Vincent GrosHeather Abimbola Aki, FNP- C in Collaboration with Dr Lyndon CodeFozia M Khan as a part of collaborative care agreement  Orders Placed This Encounter  Procedures  . Urinalysis, Routine w reflex microscopic    Meds ordered this encounter  Medications  . ofloxacin (OCUFLOX) 0.3 % ophthalmic solution    Sig: Use 4 drops in both ears twice daily for 7 days.    Dispense:  5 mL    Refill:  0    Order Specific Question:   Supervising Provider    Answer:   Lyndon CodeKHAN, FOZIA M [[id number]]  . sildenafil (VIAGRA) 100 MG tablet    Sig: Take 1 tablet (100 mg total) by mouth daily as needed for erectile dysfunction.    Dispense:  30 tablet    Refill:  3    Order Specific Question:   Supervising Provider    Answer:   Lyndon CodeKHAN, FOZIA M [[id number]]    Time spent: 3230 Minutes      Lyndon CodeFozia M Khan, MD  Internal Medicine

## 2017-08-15 LAB — URINALYSIS, ROUTINE W REFLEX MICROSCOPIC
Bilirubin, UA: NEGATIVE
Glucose, UA: NEGATIVE
Ketones, UA: NEGATIVE
Leukocytes, UA: NEGATIVE
Nitrite, UA: NEGATIVE
PH UA: 6 (ref 5.0–7.5)
PROTEIN UA: NEGATIVE
RBC, UA: NEGATIVE
Specific Gravity, UA: 1.025 (ref 1.005–1.030)
Urobilinogen, Ur: 0.2 mg/dL (ref 0.2–1.0)

## 2017-09-02 DIAGNOSIS — G8929 Other chronic pain: Secondary | ICD-10-CM | POA: Insufficient documentation

## 2017-09-02 DIAGNOSIS — R300 Dysuria: Secondary | ICD-10-CM | POA: Insufficient documentation

## 2017-09-02 DIAGNOSIS — N529 Male erectile dysfunction, unspecified: Secondary | ICD-10-CM | POA: Insufficient documentation

## 2017-09-02 DIAGNOSIS — F3341 Major depressive disorder, recurrent, in partial remission: Secondary | ICD-10-CM | POA: Insufficient documentation

## 2017-09-02 DIAGNOSIS — Z0001 Encounter for general adult medical examination with abnormal findings: Secondary | ICD-10-CM | POA: Insufficient documentation

## 2017-09-02 DIAGNOSIS — M545 Low back pain, unspecified: Secondary | ICD-10-CM | POA: Insufficient documentation

## 2017-09-02 DIAGNOSIS — H66003 Acute suppurative otitis media without spontaneous rupture of ear drum, bilateral: Secondary | ICD-10-CM | POA: Insufficient documentation

## 2017-09-04 ENCOUNTER — Other Ambulatory Visit: Payer: Self-pay

## 2017-09-04 MED ORDER — RANITIDINE HCL 150 MG PO TABS: 150.0000 mg | ORAL_TABLET | Freq: Two times a day (BID) | ORAL | 5 refills | Status: DC

## 2017-09-04 MED ORDER — VALACYCLOVIR HCL 1 G PO TABS: 1000.0000 mg | ORAL_TABLET | Freq: Every day | ORAL | 0 refills | Status: DC

## 2017-10-05 IMAGING — CT CT ABD-PELV W/ CM
1 of 2 series · 15 of 32 positions shown, 19 images · IV contrast (APPLIED)
Comparison: None.

CLINICAL DATA: Lower abdominal pain with intermittent diarrhea for
1 week.

EXAM:
CT ABDOMEN AND PELVIS WITH CONTRAST
TECHNIQUE: Multidetector CT imaging of the abdomen and pelvis was performed
using the standard protocol following bolus administration of
intravenous contrast.
CONTRAST:  100 cc Isovue 370 IV

[Series 2: axial st · axial · 0.81mm/px · z∈[-1009,-554]mm · 15 of 101 slices shown, 19 images]
[im 5/101  soft-tissue]
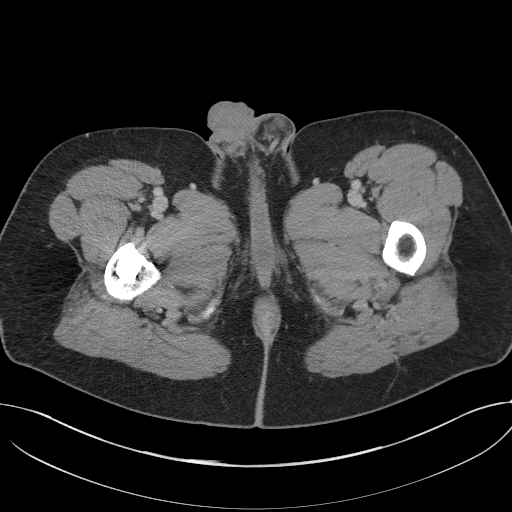
[im 5/101  bone]
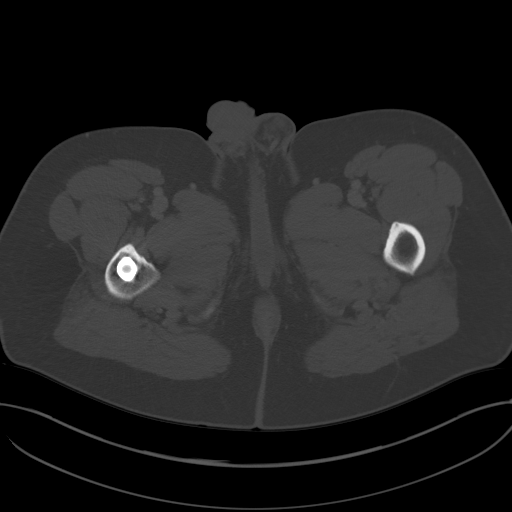
[im 14/101  soft-tissue]
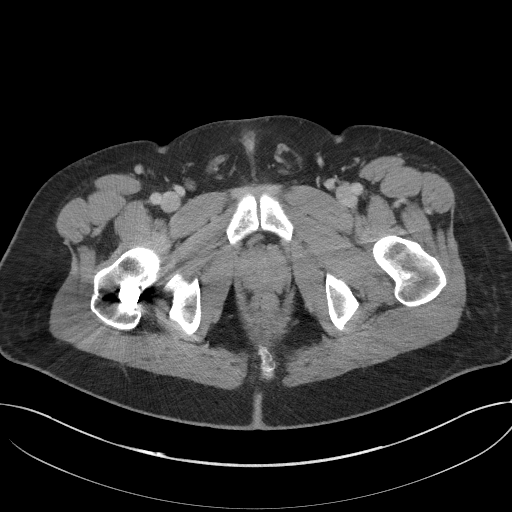
[im 22/101  soft-tissue]
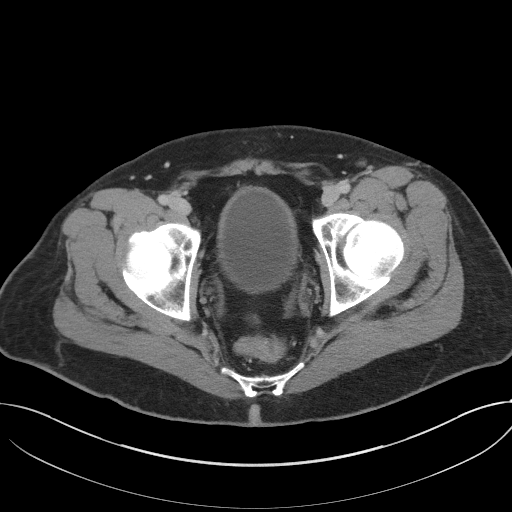
[im 27/101  soft-tissue]
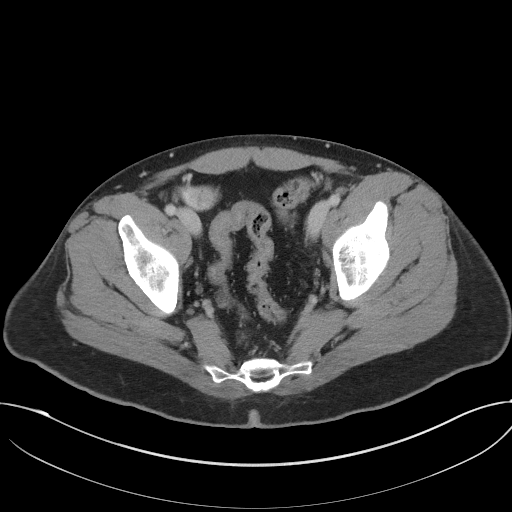
[im 35/101  soft-tissue]
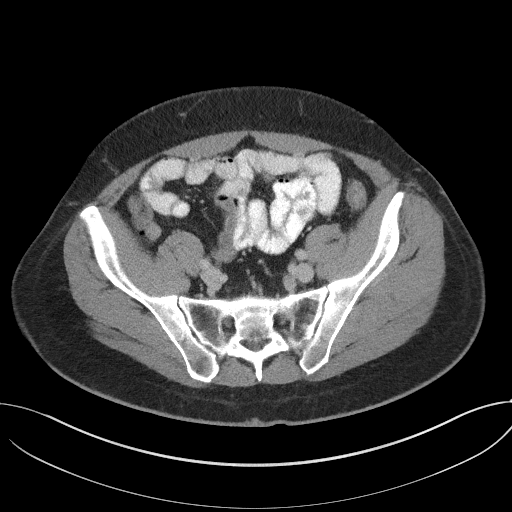
[im 44/101  soft-tissue]
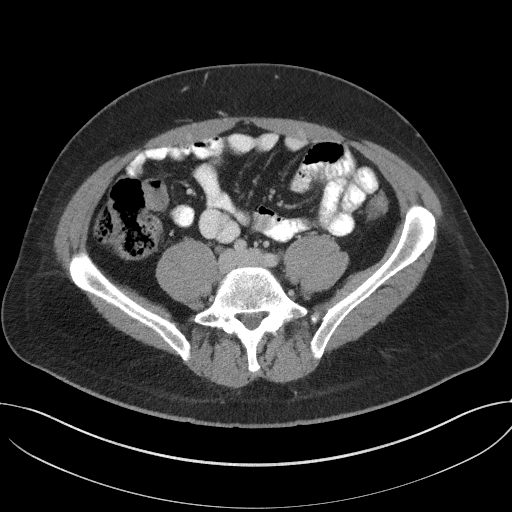
[im 53/101  soft-tissue]
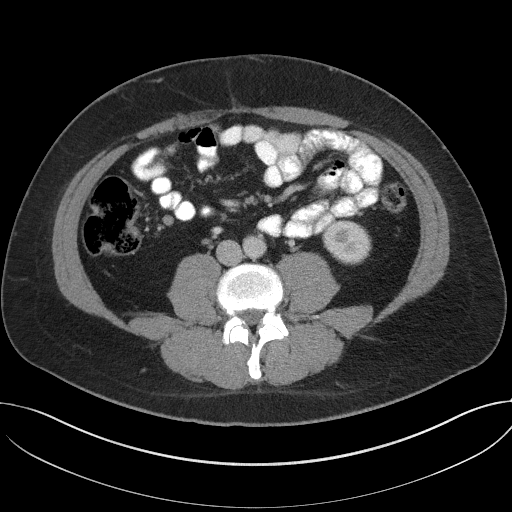
[im 57/101  soft-tissue]
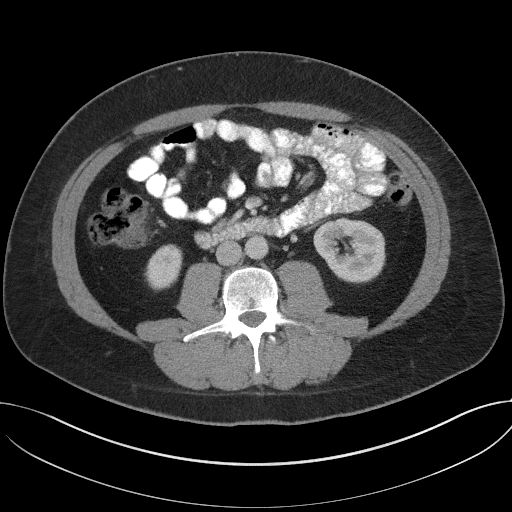
[im 66/101  soft-tissue]
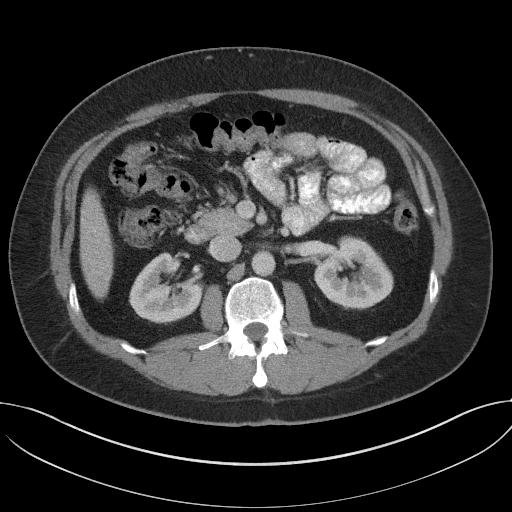
[im 66/101  bone]
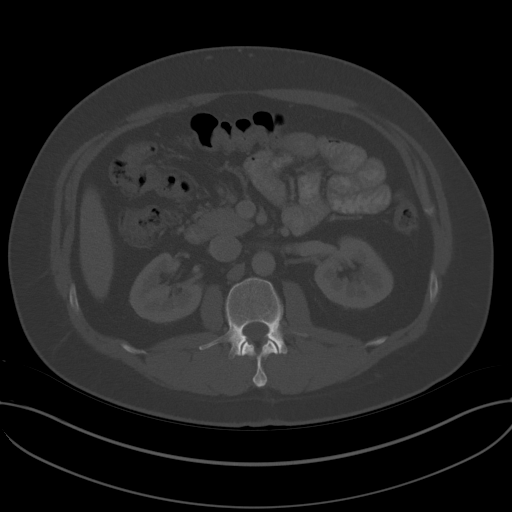
[im 74/101  soft-tissue]
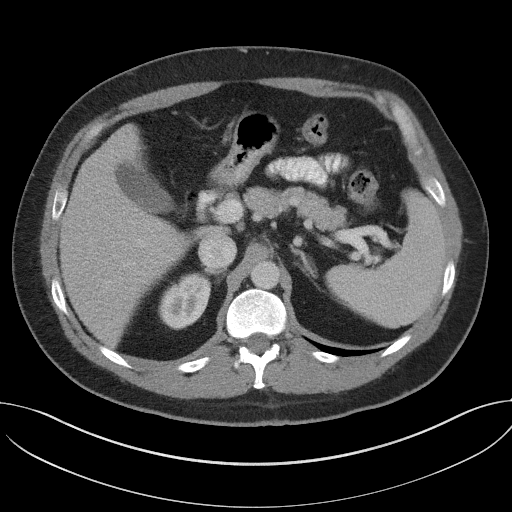
[im 79/101  soft-tissue]
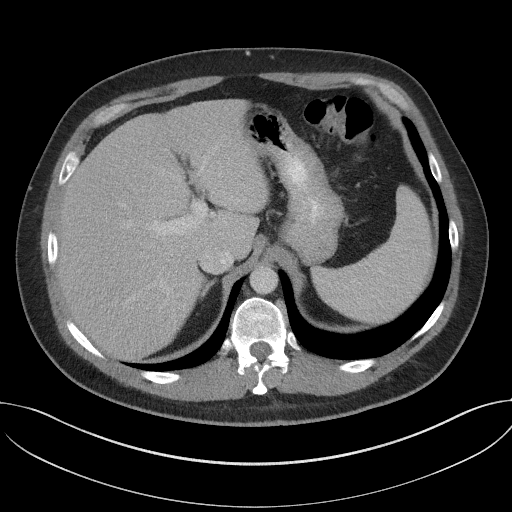
[im 83/101  lung]
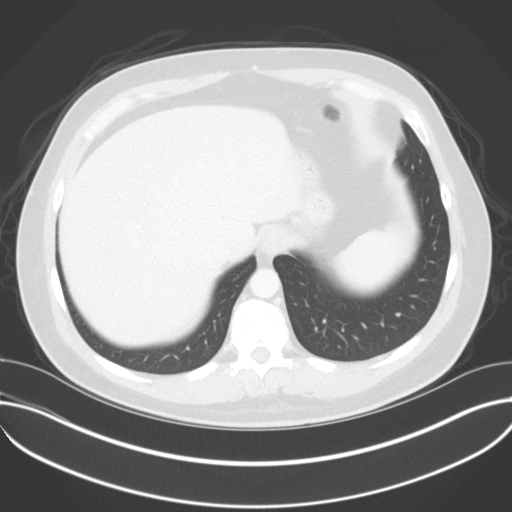
[im 87/101  soft-tissue]
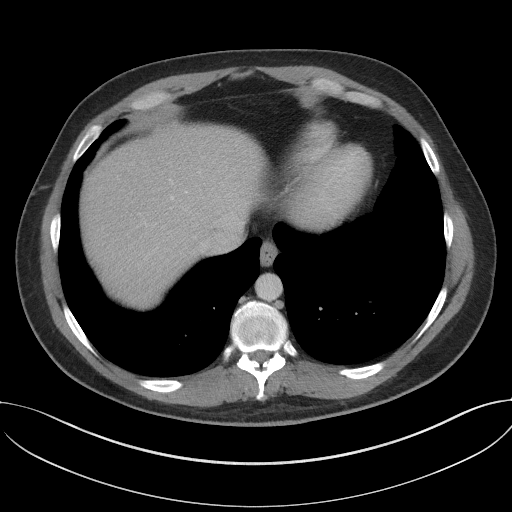
[im 87/101  lung]
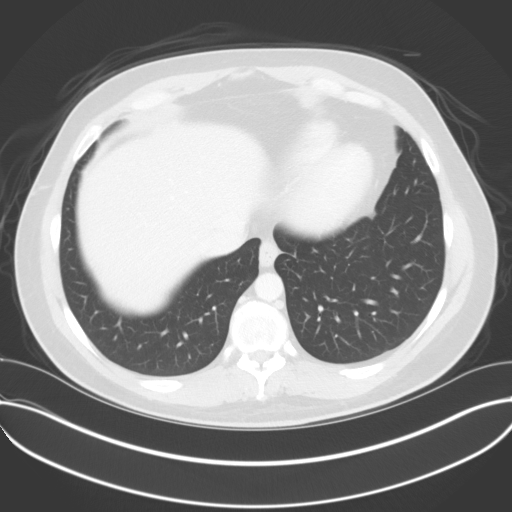
[im 92/101  lung]
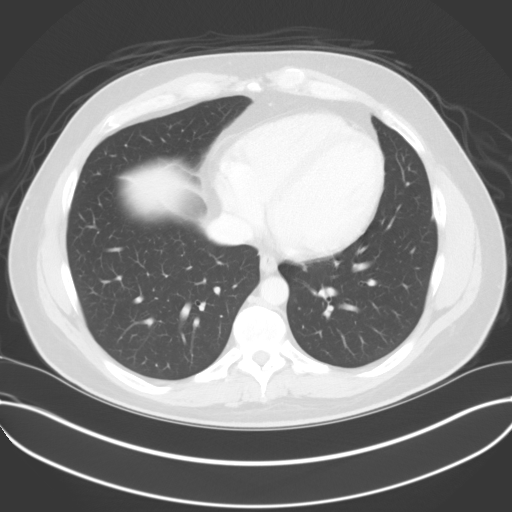
[im 96/101  soft-tissue]
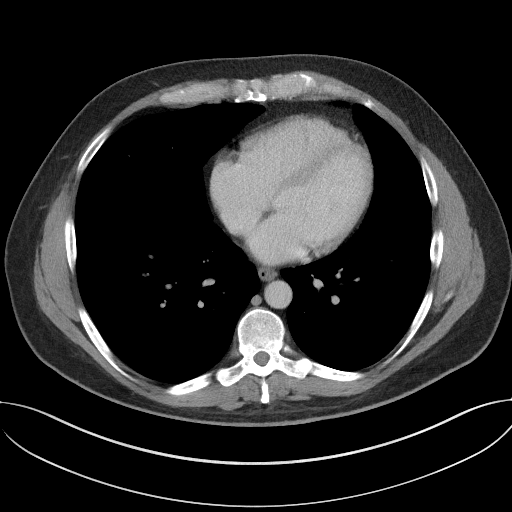
[im 96/101  lung]
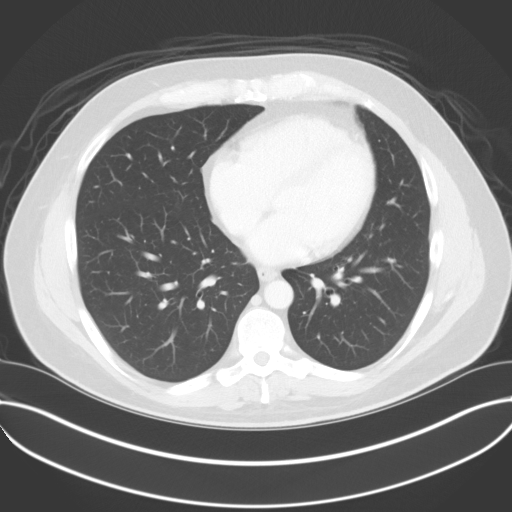

[15 of 32 positions shown; findings below may reference images not displayed]

FINDINGS: Lower chest: Lung bases are clear. No effusions. Heart is normal
size.

Hepatobiliary: No focal hepatic abnormality. Gallbladder
unremarkable.

Pancreas: No focal abnormality or ductal dilatation.

Spleen: No focal abnormality.  Normal size.

Adrenals/Urinary Tract: No adrenal abnormality. No focal renal
abnormality. No stones or hydronephrosis. Urinary bladder is
unremarkable.

Stomach/Bowel: Stomach, large and small bowel grossly unremarkable.
Normal appendix.

Vascular/Lymphatic: No evidence of aneurysm or adenopathy.

Reproductive: No visible focal abnormality.

Other: No free fluid or free air. Small left inguinal hernia
containing fat.

Musculoskeletal: No acute bony abnormality or focal bone lesion.
Degenerative disc disease at L5-S1.
IMPRESSION: No acute findings in the abdomen or pelvis.

Small left inguinal hernia containing fat.

## 2017-10-26 ENCOUNTER — Other Ambulatory Visit: Payer: Self-pay

## 2017-10-26 MED ORDER — ESCITALOPRAM OXALATE 20 MG PO TABS: 20.0000 mg | ORAL_TABLET | Freq: Every day | ORAL | 3 refills | Status: AC

## 2017-11-15 ENCOUNTER — Other Ambulatory Visit: Payer: Self-pay

## 2017-11-15 MED ORDER — CELECOXIB 200 MG PO CAPS: 200.0000 mg | ORAL_CAPSULE | Freq: Two times a day (BID) | ORAL | 1 refills | Status: DC

## 2017-11-15 MED ORDER — VALACYCLOVIR HCL 1 G PO TABS: 1000.0000 mg | ORAL_TABLET | Freq: Every day | ORAL | 0 refills | Status: DC

## 2017-12-18 ENCOUNTER — Other Ambulatory Visit: Payer: Self-pay

## 2017-12-18 MED ORDER — RANITIDINE HCL 150 MG PO TABS: 150.0000 mg | ORAL_TABLET | Freq: Two times a day (BID) | ORAL | 5 refills | Status: AC

## 2018-01-17 ENCOUNTER — Other Ambulatory Visit: Payer: Self-pay

## 2018-01-17 MED ORDER — VALACYCLOVIR HCL 1 G PO TABS: 1000.0000 mg | ORAL_TABLET | Freq: Every day | ORAL | 0 refills | Status: DC

## 2018-02-19 ENCOUNTER — Other Ambulatory Visit: Payer: Self-pay

## 2018-02-19 MED ORDER — CELECOXIB 200 MG PO CAPS: 200.0000 mg | ORAL_CAPSULE | Freq: Two times a day (BID) | ORAL | 1 refills | Status: DC

## 2018-03-13 ENCOUNTER — Other Ambulatory Visit: Payer: Self-pay

## 2018-03-13 MED ORDER — VALACYCLOVIR HCL 1 G PO TABS: 1000.0000 mg | ORAL_TABLET | Freq: Every day | ORAL | 0 refills | Status: DC

## 2018-03-29 ENCOUNTER — Other Ambulatory Visit: Payer: Self-pay

## 2018-03-29 MED ORDER — CELECOXIB 200 MG PO CAPS: 200.0000 mg | ORAL_CAPSULE | Freq: Two times a day (BID) | ORAL | 1 refills | Status: AC

## 2018-03-29 MED ORDER — VALACYCLOVIR HCL 1 G PO TABS: 1000.0000 mg | ORAL_TABLET | Freq: Every day | ORAL | 0 refills | Status: AC

## 2018-11-06 ENCOUNTER — Ambulatory Visit: Payer: BLUE CROSS/BLUE SHIELD | Admitting: Family Medicine

## 2019-02-05 ENCOUNTER — Other Ambulatory Visit: Payer: Self-pay

## 2019-02-05 DIAGNOSIS — Z20822 Contact with and (suspected) exposure to covid-19: Secondary | ICD-10-CM

## 2019-02-07 LAB — NOVEL CORONAVIRUS, NAA: SARS-CoV-2, NAA: NOT DETECTED

## 2019-09-11 ENCOUNTER — Telehealth: Payer: Self-pay

## 2019-09-11 NOTE — Telephone Encounter (Signed)
Medical record request completed and records mailed to [facility].
